# Patient Record
Sex: Female | Born: 2000 | Race: Black or African American | Hispanic: No | Marital: Single | Smoking: Never smoker
Health system: Southern US, Community
[De-identification: ages and names within clinical notes are randomized; demographics above are authoritative.]

---

## 2018-06-11 ENCOUNTER — Encounter: Payer: Self-pay | Admitting: Emergency Medicine

## 2018-06-11 ENCOUNTER — Emergency Department
Admission: EM | Admit: 2018-06-11 | Discharge: 2018-06-11 | Disposition: A | Discharge status: Home / Self Care | Attending: Emergency Medicine | Admitting: Emergency Medicine

## 2018-06-11 ENCOUNTER — Other Ambulatory Visit: Payer: Self-pay

## 2018-06-11 DIAGNOSIS — J029 Acute pharyngitis, unspecified: Secondary | ICD-10-CM | POA: Diagnosis not present

## 2018-06-11 DIAGNOSIS — R509 Fever, unspecified: Secondary | ICD-10-CM | POA: Diagnosis not present

## 2018-06-11 LAB — GROUP A STREP BY PCR: Group A Strep by PCR: NOT DETECTED

## 2018-06-11 MED ORDER — ACETAMINOPHEN 500 MG PO TABS
1000.0000 mg | ORAL_TABLET | Freq: Once | ORAL | Status: AC
  Administered 2018-06-11: 1000 mg via ORAL
  Filled 2018-06-11: qty 2

## 2018-06-11 MED ORDER — AMOXICILLIN 875 MG PO TABS: 875.0000 mg | ORAL_TABLET | Freq: Two times a day (BID) | ORAL | 0 refills | Status: AC

## 2018-06-11 NOTE — ED Provider Notes (Addendum)
[facility] EMERGENCY DEPARTMENT Provider Note   CSN: [id number] Arrival date & time: 06/11/18  1231     History   Chief Complaint Chief Complaint  Patient presents with  . Sore Throat    HPI Stacey Pratt is a 17 y.o. female presents to the emergency department for evaluation of sore throat, fever, ear pain.  Symptoms been present for 1 day.  She denies any headache, cough, congestion, runny nose, chest pain, shortness of breath or abdominal pain, nausea or vomiting.  She is tolerating p.o. well.  She has not had any Tylenol or ibuprofen today.  She denies any facial swelling.  No other rash throughout the body.  HPI  History reviewed. No pertinent past medical history.  There are no active problems to display for this patient.   History reviewed. No pertinent surgical history.   OB History   None      Home Medications    Prior to Admission medications   Medication Sig Start Date End Date Taking? Authorizing Provider  amoxicillin (AMOXIL) 875 MG tablet Take 1 tablet (875 mg total) by mouth 2 (two) times daily. X 10 days 06/11/18   Evon Slack, PA-C    Family History No family history on file.  Social History Social History   Tobacco Use  . Smoking status: Never Smoker  . Smokeless tobacco: Never Used  Substance Use Topics  . Alcohol use: Never    Frequency: Never  . Drug use: Never     Allergies   Patient has no known allergies.   Review of Systems Review of Systems  Constitutional: Positive for fever.  HENT: Positive for sore throat. Negative for congestion, ear discharge, rhinorrhea, sinus pressure, sinus pain, trouble swallowing and voice change.   Respiratory: Negative for cough, shortness of breath, wheezing and stridor.   Cardiovascular: Negative for chest pain.  Gastrointestinal: Negative for abdominal pain, diarrhea, nausea and vomiting.  Genitourinary: Negative for dysuria, flank pain and pelvic pain.    Musculoskeletal: Negative for back pain and myalgias.  Skin: Negative for rash.  Neurological: Negative for dizziness and headaches.     Physical Exam Updated Vital Signs BP 112/72 (BP Location: Right Arm)   Pulse (!) 123   Temp 99.2 F (37.3 C)   Resp 20   Ht 5' 2.5" (1.588 m)   Wt 53.5 kg   LMP 05/30/2018 (Exact Date)   SpO2 97%   BMI 21.24 kg/m   Physical Exam  Constitutional: She is oriented to person, place, and time. She appears well-developed and well-nourished.  HENT:  Head: Normocephalic and atraumatic.  Right Ear: External ear normal.  Left Ear: External ear normal.  Nose: Nose normal.  Mouth/Throat: Oropharynx is clear and moist.  Positive pharyngeal erythema with  Exudates. No uvular shifting.  Patient tolerating p.o. well.Marland Kitchen  Positive anterior cervical lymphadenopathy.  No facial swelling.  Bilateral TMs normal, mild fluid present behind both TMs no signs of infection.  Eyes: Conjunctivae are normal.  Neck: Normal range of motion.  Cardiovascular: Normal rate.  Pulmonary/Chest: Effort normal. No respiratory distress.  Abdominal: Soft. She exhibits no distension. There is no tenderness. There is no guarding.  Musculoskeletal: Normal range of motion.  Lymphadenopathy:    She has cervical adenopathy.  Neurological: She is alert and oriented to person, place, and time.  Skin: Skin is warm. No rash noted.  Psychiatric: She has a normal mood and affect. Her behavior is normal. Thought content normal.  ED Treatments / Results  Labs (all labs ordered are listed, but only abnormal results are displayed) Labs Reviewed  GROUP A STREP BY PCR    EKG None  Radiology No results found.  Procedures Procedures (including critical care time)  Medications Ordered in ED Medications  acetaminophen (TYLENOL) tablet 1,000 mg (1,000 mg Oral Given 06/11/18 1301)     Initial Impression / Assessment and Plan / ED Course  I have reviewed the triage vital signs  and the nursing notes.  Pertinent labs & imaging results that were available during my care of the patient were reviewed by me and considered in my medical decision making (see chart for details).  Clinical Course as of Jun 11 1608  Tue Jun 11, 2018  1433 Group A Strep by PCR [facility] Only) [BB]    Clinical Course User Index [BB] Henreitta LeberBustamante, Beatriz E, Student-PA    17 year old female with sore throat, fever and ear pain.  Strep PCR negative.  Temperature resolved with Tylenol.  She will alternate Tylenol and ibuprofen.  Due to clinical picture will start amoxicillin.  She understands signs and symptoms return to ED for.  Final Clinical Impressions(s) / ED Diagnoses   Final diagnoses:  Pharyngitis, unspecified etiology  Fever, unspecified fever cause    ED Discharge Orders         Ordered    amoxicillin (AMOXIL) 875 MG tablet  2 times daily     06/11/18 1608           Evon SlackGaines,  C, PA-C 06/11/18 1500    Ronnette JuniperGaines,  C, PA-C 06/11/18 1609    Governor RooksLord, Rebecca, MD 06/11/18 1620

## 2018-06-11 NOTE — ED Triage Notes (Signed)
States she developed sore throat and bilateral wear pain since yesterday  Denies any fever

## 2018-07-28 ENCOUNTER — Other Ambulatory Visit: Payer: Self-pay

## 2018-07-28 DIAGNOSIS — J209 Acute bronchitis, unspecified: Secondary | ICD-10-CM | POA: Diagnosis not present

## 2018-07-28 DIAGNOSIS — E860 Dehydration: Secondary | ICD-10-CM | POA: Diagnosis not present

## 2018-07-28 DIAGNOSIS — R05 Cough: Secondary | ICD-10-CM | POA: Diagnosis present

## 2018-07-28 LAB — POCT PREGNANCY, URINE: PREG TEST UR: NEGATIVE

## 2018-07-28 LAB — URINALYSIS, COMPLETE (UACMP) WITH MICROSCOPIC
Bilirubin Urine: NEGATIVE
Glucose, UA: NEGATIVE mg/dL
Ketones, ur: NEGATIVE mg/dL
Leukocytes, UA: NEGATIVE
NITRITE: NEGATIVE
PROTEIN: NEGATIVE mg/dL
Specific Gravity, Urine: 1.013 (ref 1.005–1.030)
pH: 6 (ref 5.0–8.0)

## 2018-07-28 LAB — CBC
HEMATOCRIT: 37.7 % (ref 36.0–46.0)
HEMOGLOBIN: 12.6 g/dL (ref 12.0–15.0)
MCH: 29.3 pg (ref 26.0–34.0)
MCHC: 33.4 g/dL (ref 30.0–36.0)
MCV: 87.7 fL (ref 80.0–100.0)
Platelets: 202 10*3/uL (ref 150–400)
RBC: 4.3 MIL/uL (ref 3.87–5.11)
RDW: 12.4 % (ref 11.5–15.5)
WBC: 3.5 10*3/uL — AB (ref 4.0–10.5)
nRBC: 0 % (ref 0.0–0.2)

## 2018-07-28 LAB — BASIC METABOLIC PANEL
Anion gap: 8 (ref 5–15)
BUN: 16 mg/dL (ref 6–20)
CHLORIDE: 102 mmol/L (ref 98–111)
CO2: 24 mmol/L (ref 22–32)
Calcium: 8.4 mg/dL — ABNORMAL LOW (ref 8.9–10.3)
Creatinine, Ser: 0.79 mg/dL (ref 0.44–1.00)
GFR calc Af Amer: >60 mL/min (ref >60)
GFR calc non Af Amer: >60 mL/min (ref >60)
GLUCOSE: 98 mg/dL (ref 70–99)
POTASSIUM: 3.4 mmol/L — AB (ref 3.5–5.1)
Sodium: 134 mmol/L — ABNORMAL LOW (ref 135–145)

## 2018-07-28 NOTE — ED Triage Notes (Signed)
Patient reports symptoms since 1/3, worse today.  Reports coughing until she vomits and having intermittent dizzy spells.

## 2018-07-29 ENCOUNTER — Emergency Department

## 2018-07-29 ENCOUNTER — Emergency Department
Admission: EM | Admit: 2018-07-29 | Discharge: 2018-07-29 | Disposition: A | Discharge status: Home / Self Care | Attending: Emergency Medicine | Admitting: Emergency Medicine

## 2018-07-29 DIAGNOSIS — J209 Acute bronchitis, unspecified: Secondary | ICD-10-CM | POA: Diagnosis not present

## 2018-07-29 DIAGNOSIS — R42 Dizziness and giddiness: Secondary | ICD-10-CM

## 2018-07-29 DIAGNOSIS — E860 Dehydration: Secondary | ICD-10-CM

## 2018-07-29 DIAGNOSIS — J40 Bronchitis, not specified as acute or chronic: Secondary | ICD-10-CM

## 2018-07-29 MED ORDER — HYDROCOD POLST-CPM POLST ER 10-8 MG/5ML PO SUER
5.0000 mL | Freq: Once | ORAL | Status: AC
  Administered 2018-07-29: 5 mL via ORAL
  Filled 2018-07-29: qty 5

## 2018-07-29 MED ORDER — KETOROLAC TROMETHAMINE 30 MG/ML IJ SOLN
10.0000 mg | Freq: Once | INTRAMUSCULAR | Status: DC
  Filled 2018-07-29: qty 1

## 2018-07-29 MED ORDER — DEXTROSE-NACL 5-0.45 % IV SOLN: Freq: Once | INTRAVENOUS | Status: DC

## 2018-07-29 MED ORDER — IBUPROFEN 600 MG PO TABS
600.0000 mg | ORAL_TABLET | Freq: Once | ORAL | Status: AC
  Administered 2018-07-29: 600 mg via ORAL
  Filled 2018-07-29: qty 1

## 2018-07-29 MED ORDER — HYDROCOD POLST-CPM POLST ER 10-8 MG/5ML PO SUER: 5.0000 mL | Freq: Two times a day (BID) | ORAL | 0 refills | Status: DC

## 2018-07-29 MED ORDER — METHYLPREDNISOLONE 4 MG PO TBPK: ORAL_TABLET | ORAL | 0 refills | Status: AC

## 2018-07-29 NOTE — ED Notes (Signed)
ED Provider at bedside. 

## 2018-07-29 NOTE — ED Provider Notes (Signed)
[facility] Emergency Department Provider Note   ____________________________________________   First MD Initiated Contact with Patient 07/29/18 0050     (approximate)  I have reviewed the triage vital signs and the nursing notes.   HISTORY  Chief Complaint Cough and Dizziness    HPI Stacey Pratt is a 18 y.o. female who presents to the ED from home with a chief complaint of cold symptoms and dizziness.  Patient reports a 3-day history of cough productive of sputum.  Associated posttussive emesis.  Also reports dizziness like the room is spinning, particularly when she stands.  Had a fever the first day for which she took Tylenol but none since.  Denies associated chills, chest pain, shortness of breath, abdominal pain, dysuria or diarrhea.  Denies recent travel or trauma.   Past medical history None  There are no active problems to display for this patient.   No past surgical history on file.  Prior to Admission medications   Medication Sig Start Date End Date Taking? Authorizing Provider  amoxicillin (AMOXIL) 875 MG tablet Take 1 tablet (875 mg total) by mouth 2 (two) times daily. X 10 days 06/11/18   Evon SlackGaines, Thomas C, PA-C  chlorpheniramine-HYDROcodone [facility](TUSSIONEX PENNKINETIC ER) 10-8 MG/5ML SUER Take 5 mLs by mouth 2 (two) times daily. 07/29/18   Irean HongSung,  J, MD  methylPREDNISolone (MEDROL DOSEPAK) 4 MG TBPK tablet Take as directed 07/29/18   Irean HongSung,  J, MD    Allergies Patient has no known allergies.  No family history on file.  Social History Social History   Tobacco Use  . Smoking status: Never Smoker  . Smokeless tobacco: Never Used  Substance Use Topics  . Alcohol use: Never    Frequency: Never  . Drug use: Never    Review of Systems  Constitutional: No fever/chills Eyes: No visual changes. ENT: No sore throat. Cardiovascular: Denies chest pain. Respiratory: Positive for cough.  Denies shortness of  breath. Gastrointestinal: No abdominal pain.  Positive for posttussive emesis.  No diarrhea.  No constipation. Genitourinary: Negative for dysuria. Musculoskeletal: Negative for back pain. Skin: Negative for rash. Neurological: Positive for dizziness.  Negative for headaches, focal weakness or numbness.   ____________________________________________   PHYSICAL EXAM:  VITAL SIGNS: ED Triage Vitals  Enc Vitals Group     BP 07/28/18 2236 102/71     Pulse Rate 07/28/18 2236 95     Resp 07/28/18 2236 18     Temp 07/28/18 2236 98.5 F (36.9 C)     Temp Source 07/28/18 2236 Oral     SpO2 07/28/18 2236 97 %     Weight 07/28/18 2237 118 lb (53.5 kg)     Height 07/28/18 2237 5\' 2"  (1.575 m)     Head Circumference --      Peak Flow --      Pain Score 07/28/18 2236 0     Pain Loc --      Pain Edu? --      Excl. in GC? --     Constitutional: Alert and oriented. Well appearing and in no acute distress. Eyes: Conjunctivae are normal. PERRL. EOMI. Head: Atraumatic. Nose: No congestion/rhinnorhea. Mouth/Throat: Mucous membranes are moist.  Oropharynx mildly erythematous without tonsillar swelling, exudates or peritonsillar abscess.  There is no hoarse or muffled voice.  There is no drooling. Neck: No stridor.  Neck without meningismus. Cardiovascular: Normal rate, regular rhythm. Grossly normal heart sounds.  Good peripheral circulation. Respiratory: Normal respiratory effort.  No  retractions. Lungs CTAB. Gastrointestinal: Soft and nontender. No distention. No abdominal bruits. No CVA tenderness. Musculoskeletal: No lower extremity tenderness nor edema.  No joint effusions. Neurologic:  Normal speech and language. No gross focal neurologic deficits are appreciated. No gait instability. Skin:  Skin is warm, dry and intact. No rash noted.  No petechiae. Psychiatric: Mood and affect are normal. Speech and behavior are normal.  ____________________________________________   LABS (all  labs ordered are listed, but only abnormal results are displayed)  Labs Reviewed  BASIC METABOLIC PANEL - Abnormal; Notable for the following components:      Result Value   Sodium 134 (*)    Potassium 3.4 (*)    Calcium 8.4 (*)    All other components within normal limits  CBC - Abnormal; Notable for the following components:   WBC 3.5 (*)    All other components within normal limits  URINALYSIS, COMPLETE (UACMP) WITH MICROSCOPIC - Abnormal; Notable for the following components:   Color, Urine YELLOW (*)    APPearance HAZY (*)    Hgb urine dipstick LARGE (*)    Bacteria, UA RARE (*)    All other components within normal limits  POC URINE PREG, ED  POCT PREGNANCY, URINE  CBG MONITORING, ED   ____________________________________________  EKG  ED ECG REPORT I, , J, the attending physician, personally viewed and interpreted this ECG.   Date: 07/29/2018  EKG Time: 2243  Rate: 101  Rhythm: normal EKG, normal sinus rhythm  Axis: Normal  Intervals:none  ST&T Change: Nonspecific  ____________________________________________  RADIOLOGY  ED MD interpretation: No pneumonia  Official radiology report(s): Dg Chest 2 View  Result Date: 07/29/2018 CLINICAL DATA:  Cough. EXAM: CHEST - 2 VIEW COMPARISON:  None. FINDINGS: The cardiomediastinal contours are normal. The lungs are clear. Pulmonary vasculature is normal. No consolidation, pleural effusion, or pneumothorax. No acute osseous abnormalities are seen. IMPRESSION: Negative radiographs of the chest. Electronically Signed   By: Narda Rutherford M.D.   On: 07/29/2018 01:07    ____________________________________________   PROCEDURES  Procedure(s) performed: None  Procedures  Critical Care performed: No  ____________________________________________   INITIAL IMPRESSION / ASSESSMENT AND PLAN / ED COURSE  As part of my medical decision making, I reviewed the following data within the electronic MEDICAL RECORD NUMBER  Nursing notes reviewed and incorporated, Labs reviewed, EKG interpreted, Old chart reviewed, Radiograph reviewed and Notes from prior ED visits   18 year old otherwise healthy female who presents with cough, posttussive emesis, dizziness.  Differential diagnosis includes but is not limited to influenza, URI, community-acquired pneumonia, bronchitis, dehydration etc.  Laboratory results unremarkable.  Patient is orthostatic on standing.  Chest x-ray negative for pneumonia.  Will initiate IV fluid resuscitation, Tussionex for cough, IV Toradol for myalgias and reassess.  Clinical Course as of Jul 29 149  Sheral Flow Jul 29, 2018  0151 Patient refused IV because she is scared of needles.  Updated her on unremarkable chest x-ray.  Will discharge home on Tussionex as needed, Medrol Dosepak and she will follow-up with her PCP.  Strict return precautions given.  Patient verbalizes understanding and agrees with plan of care.   [JS]    Clinical Course User Index [JS] Irean Hong, MD     ____________________________________________   FINAL CLINICAL IMPRESSION(S) / ED DIAGNOSES  Final diagnoses:  Bronchitis  Dizziness  Dehydration     ED Discharge Orders         Ordered    methylPREDNISolone (MEDROL DOSEPAK) 4 MG TBPK  tablet     07/29/18 0102    chlorpheniramine-HYDROcodone (TUSSIONEX PENNKINETIC ER) 10-8 MG/5ML SUER  2 times daily     07/29/18 0102           Note:  This document was prepared using Dragon voice recognition software and may include unintentional dictation errors.    Irean Hong, MD 07/29/18 [phone]

## 2018-07-29 NOTE — ED Notes (Signed)
Patient transported to X-ray 

## 2018-07-29 NOTE — Discharge Instructions (Addendum)
1.  Take steroid taper as prescribed. 2.  You may take Tussionex as needed for cough. 3.  Drink plenty of fluids daily. 4.  Return to the ER for worsening symptoms, persistent vomiting, difficulty breathing or other concerns.

## 2019-03-04 IMAGING — CR DG CHEST 2V
1 series · 2 of 2 positions shown · non-contrast
Comparison: None.

CLINICAL DATA: Cough.

EXAM:
CHEST - 2 VIEW

[Series 1: dg chest 2 view · 0.14mm/px · 2 of 2 slices shown]
[im 1/2]
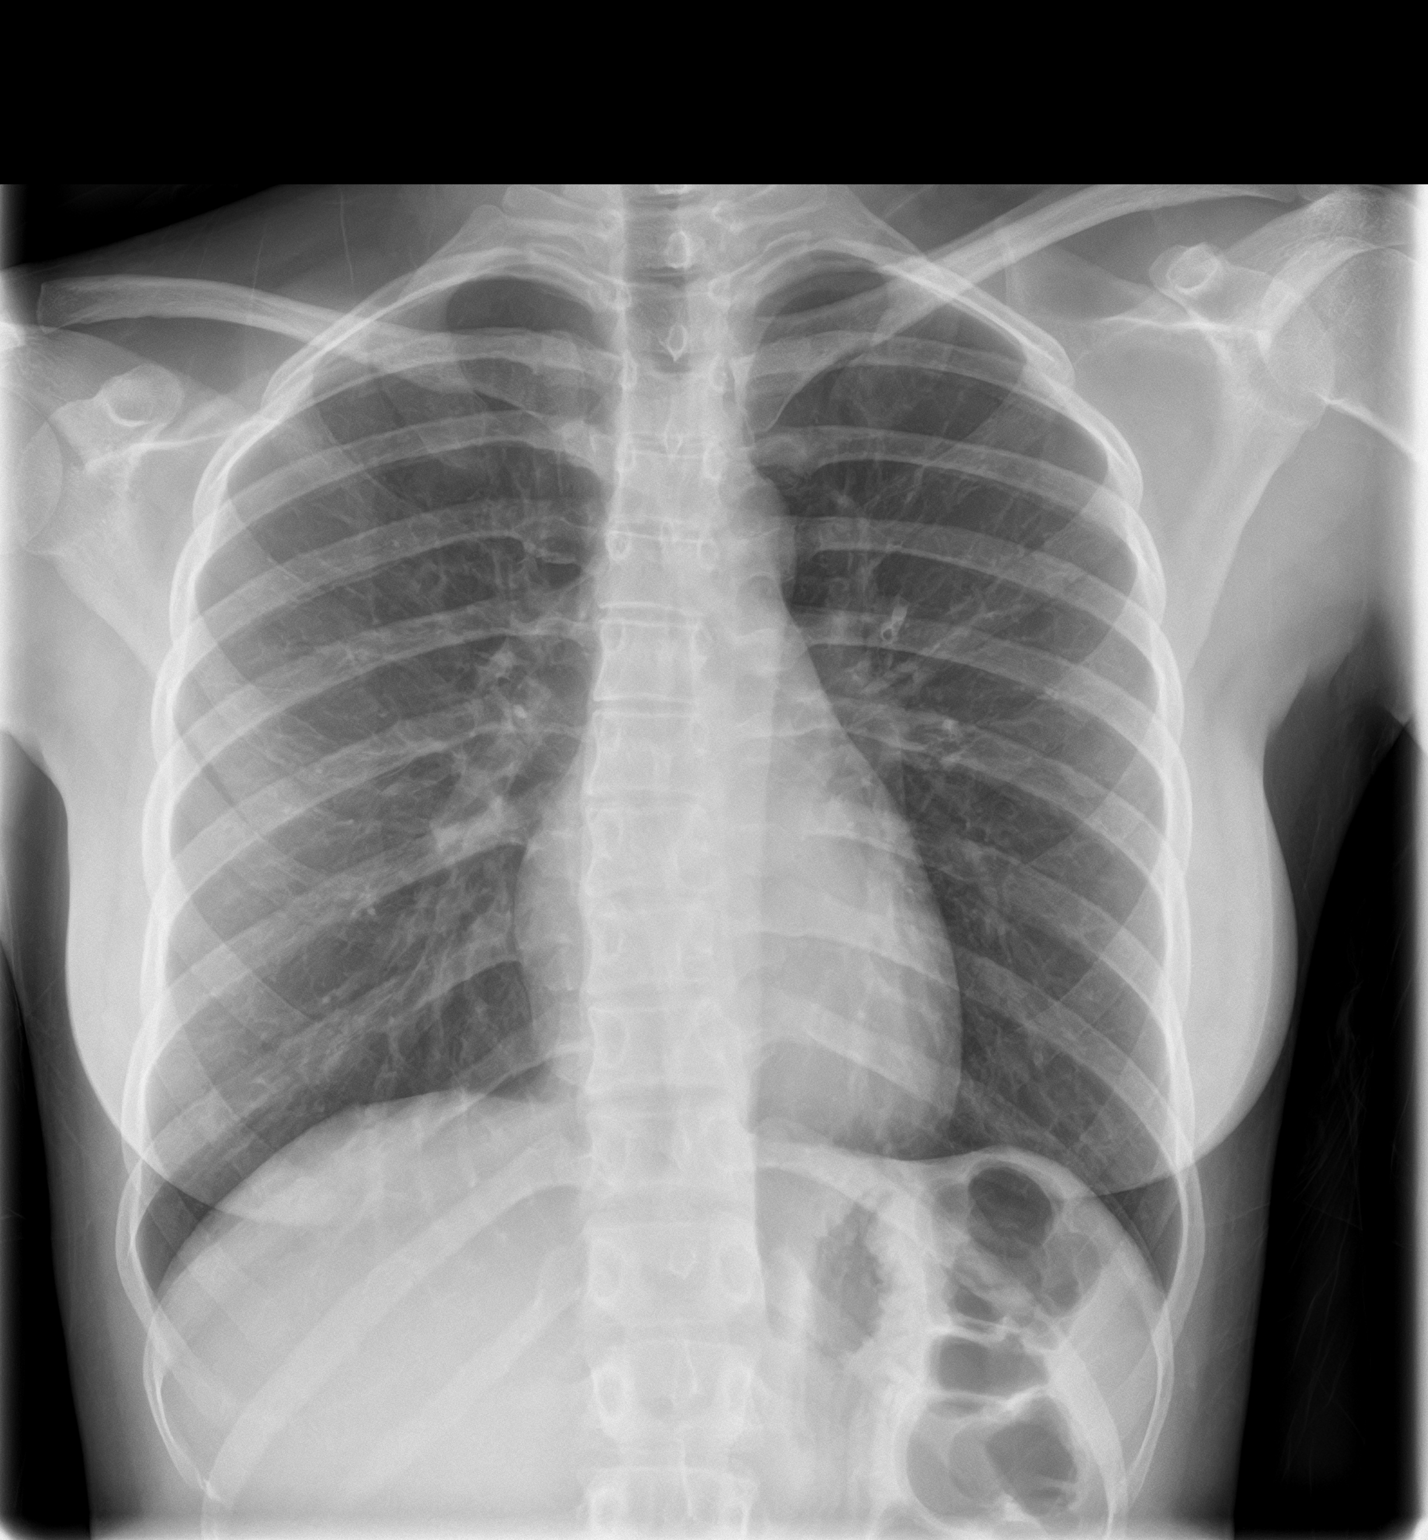
[im 2/2]
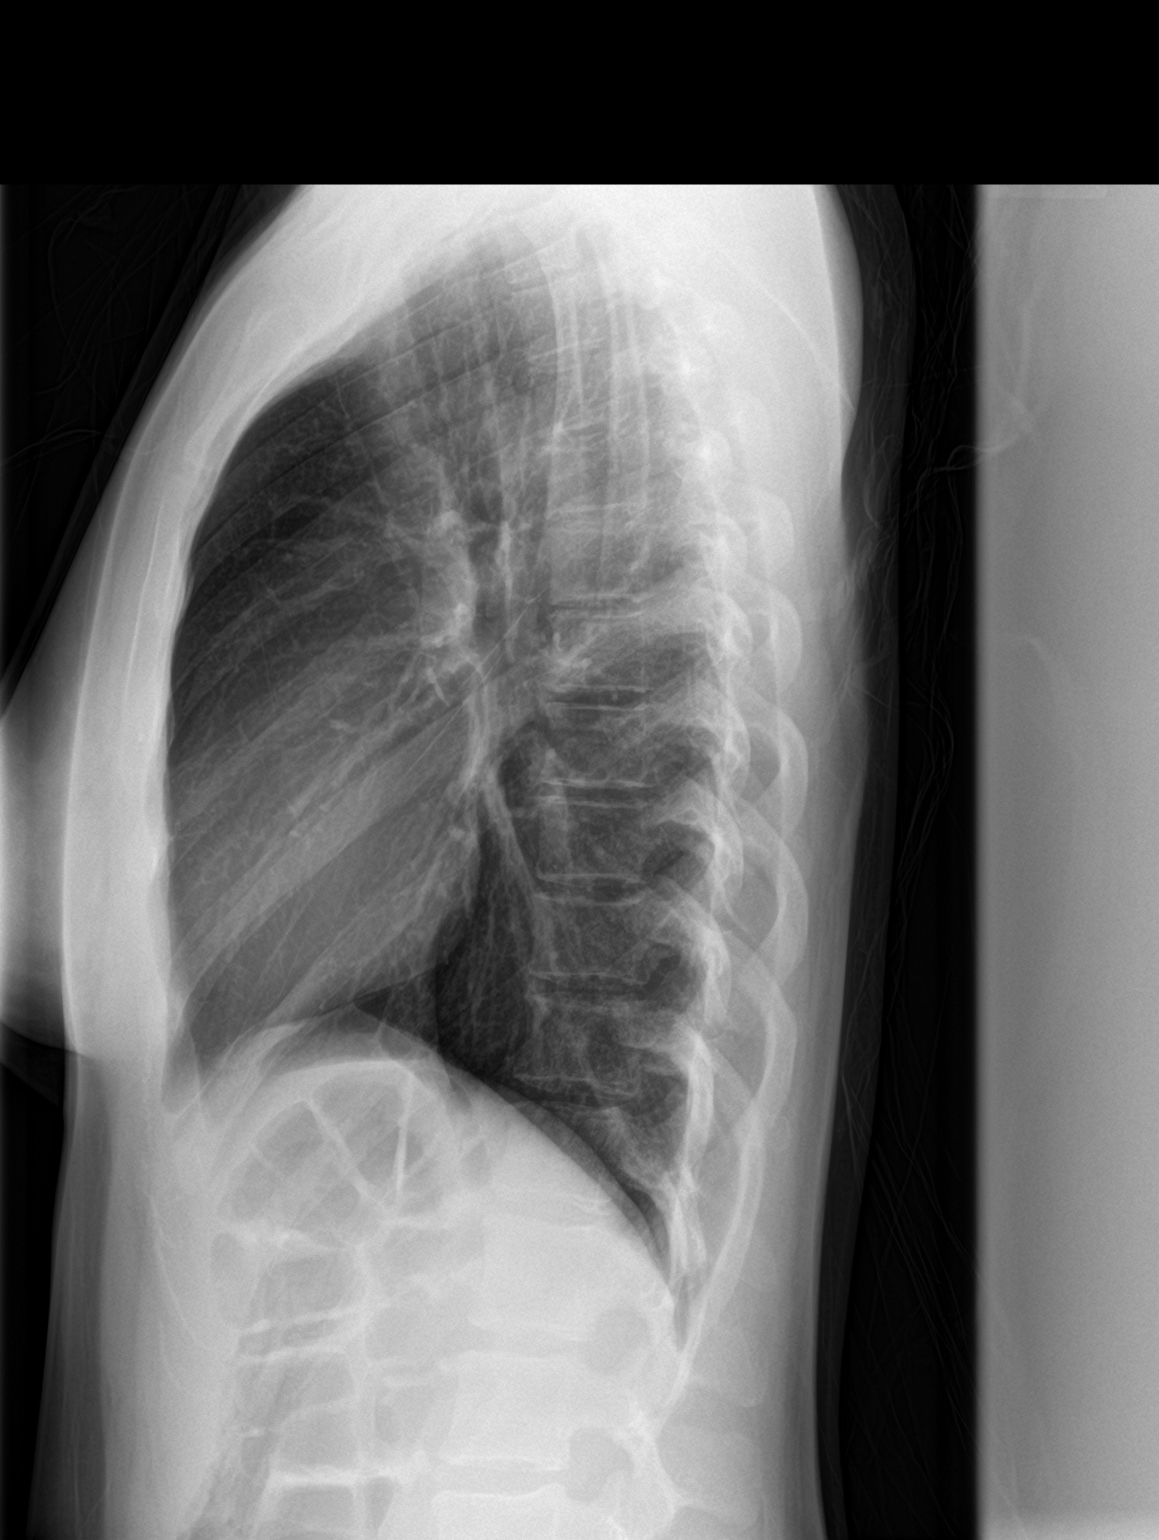

[2 of 2 positions shown; findings below may reference images not displayed]

FINDINGS: The cardiomediastinal contours are normal. The lungs are clear.
Pulmonary vasculature is normal. No consolidation, pleural effusion,
or pneumothorax. No acute osseous abnormalities are seen.
IMPRESSION: Negative radiographs of the chest.

## 2021-06-25 ENCOUNTER — Other Ambulatory Visit: Payer: Self-pay

## 2021-06-25 ENCOUNTER — Emergency Department
Admission: EM | Admit: 2021-06-25 | Discharge: 2021-06-25 | Disposition: A | Discharge status: Home / Self Care | Payer: PRIVATE HEALTH INSURANCE | Attending: Emergency Medicine | Admitting: Emergency Medicine

## 2021-06-25 DIAGNOSIS — R059 Cough, unspecified: Secondary | ICD-10-CM | POA: Diagnosis present

## 2021-06-25 DIAGNOSIS — Z20822 Contact with and (suspected) exposure to covid-19: Secondary | ICD-10-CM | POA: Diagnosis not present

## 2021-06-25 DIAGNOSIS — B349 Viral infection, unspecified: Secondary | ICD-10-CM | POA: Insufficient documentation

## 2021-06-25 LAB — RESP PANEL BY RT-PCR (FLU A&B, COVID) ARPGX2
Influenza A by PCR: NEGATIVE
Influenza B by PCR: NEGATIVE
SARS Coronavirus 2 by RT PCR: NEGATIVE

## 2021-06-25 MED ORDER — GUAIFENESIN-CODEINE 100-10 MG/5ML PO SYRP: 5.0000 mL | ORAL_SOLUTION | Freq: Three times a day (TID) | ORAL | 0 refills | Status: AC | PRN

## 2021-06-25 NOTE — ED Triage Notes (Signed)
Pt reports started with a sore throat several days ago and now has a hoarse voice. Pt also reports fever cough and congestion.

## 2021-06-25 NOTE — ED Provider Notes (Signed)
[facility] Emergency Department Provider Note  ____________________________________________  Time seen: Approximately 2:47 PM  I have reviewed the triage vital signs and the nursing notes.   HISTORY  Chief Complaint Cough, Sore Throat, and Fever   HPI Stacey Pratt is a 20 y.o. female presenting to the emergency department for treatment and evaluation of cough, congestion, sore throat for the past 4 days.  She had fever on day 1 of symptoms, but not since then.  She awakened this morning with worsening hoarseness.   History reviewed. No pertinent past medical history.  There are no problems to display for this patient.   History reviewed. No pertinent surgical history.  Prior to Admission medications   Medication Sig Start Date End Date Taking? Authorizing Provider  guaiFENesin-codeine (ROBITUSSIN AC) 100-10 MG/5ML syrup Take 5 mLs by mouth 3 (three) times daily as needed for cough. 06/25/21  Yes Lei Dower B, FNP  amoxicillin (AMOXIL) 875 MG tablet Take 1 tablet (875 mg total) by mouth 2 (two) times daily. X 10 days 06/11/18   Evon Slack, PA-C  methylPREDNISolone (MEDROL DOSEPAK) 4 MG TBPK tablet Take as directed 07/29/18   Irean Hong, MD    Allergies Patient has no known allergies.  No family history on file.  Social History Social History   Tobacco Use   Smoking status: Never   Smokeless tobacco: Never  Substance Use Topics   Alcohol use: Never   Drug use: Never    Review of Systems Constitutional: Negative for fever/chills. Normal appetite. ENT: Positive for sore throat. Cardiovascular: Denies chest pain. Respiratory: Negative for shortness of breath. Positive for cough. Negative for wheezing.  Gastrointestinal: No nausea,  no vomiting.  no diarrhea.  Musculoskeletal: Negative for body aches Skin: Negative for rash. Neurological: Negative for headaches ____________________________________________   PHYSICAL  EXAM:  VITAL SIGNS: ED Triage Vitals  Enc Vitals Group     BP 06/25/21 1332 123/84     Pulse Rate 06/25/21 1332 (!) 103     Resp 06/25/21 1332 20     Temp 06/25/21 1332 98.3 F (36.8 C)     Temp Source 06/25/21 1332 Oral     SpO2 06/25/21 1332 99 %     Weight 06/25/21 1330 132 lb (59.9 kg)     Height 06/25/21 1330 5\' 2"  (1.575 m)     Head Circumference --      Peak Flow --      Pain Score 06/25/21 1330 0     Pain Loc --      Pain Edu? --      Excl. in GC? --     Constitutional: Alert and oriented. Overall well appearing and in no acute distress. Eyes: Conjunctivae are normal. Ears: TM normal Nose: Maxillary sinus congestion noted; no rhinnorhea. Mouth/Throat: Mucous membranes are moist.  Oropharynx erythematous. Tonsils 2+ without exudate. Uvula midline. Neck: No stridor.  Lymphatic: No cervical lymphadenopathy. Cardiovascular: Normal rate, regular rhythm. Good peripheral circulation. Respiratory: Respirations are even and unlabored.  No retractions. Breath sounds clear. Gastrointestinal: Soft and nontender.  Musculoskeletal: FROM x 4 extremities.  Neurologic:  Normal speech and language. Skin:  Skin is warm, dry and intact. No rash noted. Psychiatric: Mood and affect are normal. Speech and behavior are normal.  ____________________________________________   LABS (all labs ordered are listed, but only abnormal results are displayed)  Labs Reviewed  RESP PANEL BY RT-PCR (FLU A&B, COVID) [id number]   ____________________________________________  EKG  Not indicated  ____________________________________________  RADIOLOGY  Not indicated ____________________________________________   PROCEDURES  Procedure(s) performed: None  Critical Care performed: No ____________________________________________   INITIAL IMPRESSION / ASSESSMENT AND PLAN / ED COURSE  20 y.o. female presenting to the emergency department for treatment and evaluation of viral symptoms as  described in the HPI.  Exam is overall reassuring.  She is negative for flu and COVID.  She will be treated with Robitussin-AC.  She was advised to follow-up with primary care or return to the emergency department for symptoms of change or worsen or for new concerns.  Medications - No data to display  ED Discharge Orders          Ordered    guaiFENesin-codeine (ROBITUSSIN AC) 100-10 MG/5ML syrup  3 times daily PRN        06/25/21 1444             Pertinent labs & imaging results that were available during my care of the patient were reviewed by me and considered in my medical decision making (see chart for details).    If controlled substance prescribed during this visit, 12 month history viewed on the [facility] prior to issuing an initial prescription for Schedule II or III opiod. ____________________________________________   FINAL CLINICAL IMPRESSION(S) / ED DIAGNOSES  Final diagnoses:  Acute viral syndrome    Note:  This document was prepared using Dragon voice recognition software and may include unintentional dictation errors.     Chinita Pester, FNP 06/25/21 1454    Minna Antis, MD 06/25/21 1507

## 2021-09-16 ENCOUNTER — Other Ambulatory Visit: Payer: Self-pay | Admitting: Certified Nurse Midwife

## 2021-09-16 DIAGNOSIS — R102 Pelvic and perineal pain: Secondary | ICD-10-CM

## 2021-09-27 ENCOUNTER — Other Ambulatory Visit: Payer: Medicaid Other

## 2021-09-29 ENCOUNTER — Ambulatory Visit
Admission: RE | Admit: 2021-09-29 | Discharge: 2021-09-29 | Disposition: A | Discharge status: Home / Self Care | Payer: Medicaid Other | Source: Ambulatory Visit | Attending: Certified Nurse Midwife | Admitting: Certified Nurse Midwife

## 2021-09-29 DIAGNOSIS — R102 Pelvic and perineal pain: Secondary | ICD-10-CM
